# Patient Record
Sex: Male | Born: 2005 | Race: Black or African American | Hispanic: No | Marital: Single | Smoking: Never smoker
Health system: Southern US, Community
[De-identification: ages and names within clinical notes are randomized; demographics above are authoritative.]

## PROBLEM LIST (undated history)

## (undated) DIAGNOSIS — Q539 Undescended testicle, unspecified: Secondary | ICD-10-CM

## (undated) DIAGNOSIS — Z87898 Personal history of other specified conditions: Secondary | ICD-10-CM

## (undated) HISTORY — DX: Undescended testicle, unspecified: Q53.9

## (undated) HISTORY — DX: Personal history of other specified conditions: Z87.898

---
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
## 2023-02-03 ENCOUNTER — Encounter: Payer: Self-pay | Admitting: Cardiovascular Disease

## 2023-02-03 ENCOUNTER — Ambulatory Visit: Payer: Medicaid Other | Attending: Cardiovascular Disease

## 2023-02-03 ENCOUNTER — Ambulatory Visit: Payer: Medicaid Other | Attending: Cardiovascular Disease | Admitting: Cardiovascular Disease

## 2023-02-03 VITALS — BP 124/84 | HR 51 | Ht 73.0 in | Wt 156.2 lb

## 2023-02-03 DIAGNOSIS — R001 Bradycardia, unspecified: Secondary | ICD-10-CM | POA: Diagnosis not present

## 2023-02-03 DIAGNOSIS — R55 Syncope and collapse: Secondary | ICD-10-CM

## 2023-02-03 NOTE — Patient Instructions (Signed)
*If you need a refill on your cardiac medications before your next appointment, please call your pharmacy*   Lab Work: RETURN FOR FASTING LABS If you have labs (blood work) drawn today and your tests are completely normal, you will receive your results only by: MyChart Message (if you have MyChart) OR A paper copy in the mail If you have any lab test that is abnormal or we need to change your treatment, we will call you to review the results.   Testing/Procedures: Your physician has requested that you have an echocardiogram. Echocardiography is a painless test that uses sound waves to create images of your heart. It provides your doctor with information about the size and shape of your heart and how well your heart's chambers and valves are working. This procedure takes approximately one hour. There are no restrictions for this procedure. Please do NOT wear cologne, perfume, aftershave, or lotions (deodorant is allowed). Please arrive 15 minutes prior to your appointment time.  Please note: We ask at that you not bring children with you during ultrasound (echo/ vascular) testing. Due to room size and safety concerns, children are not allowed in the ultrasound rooms during exams. Our front office staff cannot provide observation of children in our lobby area while testing is being conducted. An adult accompanying a patient to their appointment will only be allowed in the ultrasound room at the discretion of the ultrasound technician under special circumstances. We apologize for any inconvenience.   ZIO XT- Long Term Monitor Instructions   Your physician has requested you wear your ZIO patch monitor__14_____days.   This is a single patch monitor.  [employer] supplies one patch monitor per enrollment.  Additional stickers are not available.   Please do not apply patch if you will be having a Nuclear Stress Test, Echocardiogram, Cardiac CT, MRI, or Chest Xray during the time frame you would be  wearing the monitor. The patch cannot be worn during these tests.  You cannot remove and re-apply the ZIO XT patch monitor.   Your ZIO patch monitor will be sent USPS Priority mail from [facility] directly to your home address. The monitor may also be mailed to a PO BOX if home delivery is not available.   It may take 3-5 days to receive your monitor after you have been enrolled.   Once you have received you monitor, please review enclosed instructions.  Your monitor has already been registered assigning a specific monitor serial # to you.   Applying the monitor   Shave hair from upper left chest.   Hold abrader disc by orange tab.  Rub abrader in 40 strokes over left upper chest as indicated in your monitor instructions.   Clean area with 4 enclosed alcohol pads .  Use all pads to assure are is cleaned thoroughly.  Let dry.   Apply patch as indicated in monitor instructions.  Patch will be place under collarbone on left side of chest with arrow pointing upward.   Rub patch adhesive wings for 2 minutes.Remove white label marked "1".  Remove white label marked "2".  Rub patch adhesive wings for 2 additional minutes.   While looking in a mirror, press and release button in center of patch.  A small green light will flash 3-4 times .  This will be your only indicator the monitor has been turned on.     Do not shower for the first 24 hours.  You may shower after the first 24 hours.  Press button if you feel a symptom. You will hear a small click.  Record Date, Time and Symptom in the Patient Log Book.   When you are ready to remove patch, follow instructions on last 2 pages of Patient Log Book.  Stick patch monitor onto last page of Patient Log Book.   Place Patient Log Book in [place] box.  Use locking tab on box and tape box closed securely.  The Orange and [employer] has [employer] on it.  Please place in mailbox as soon as possible.  Your physician should have your test results  approximately 7 days after the monitor has been mailed back to [facility].   Call [facility] Customer Care at [phone] if you have questions regarding your ZIO XT patch monitor.  Call them immediately if you see an orange light blinking on your monitor.   If your monitor falls off in less than 4 days contact our Monitor department at [phone].  If your monitor becomes loose or falls off after 4 days call [employer] at [phone] for suggestions on securing your monitor.     Follow-Up: At [facility], you and your health needs are our priority.  As part of our continuing mission to provide you with exceptional heart care, we have created designated Provider Care Teams.  These Care Teams include your primary Cardiologist (physician) and Advanced Practice Providers (APPs -  Physician Assistants and Nurse Practitioners) who all work together to provide you with the care you need, when you need it.  We recommend signing up for the patient portal called "MyChart".  Sign up information is provided on this After Visit Summary.  MyChart is used to connect with patients for Virtual Visits (Telemedicine).  Patients are able to view lab/test results, encounter notes, upcoming appointments, etc.  Non-urgent messages can be sent to your provider as well.   To learn more about what you can do with MyChart, go to [email or web address].    Your next appointment:   2 month(s)  Provider:   Dr. Nicki Guadalajara or next available APP

## 2023-02-03 NOTE — Progress Notes (Unsigned)
Enrolled for [employer] to mail a ZIO XT long term holter monitor to the patients address on file.  

## 2023-02-03 NOTE — Progress Notes (Signed)
Cardiology Office Note    Date:  02/05/2023   ID:  Keith Berger, DOB September 19, 2005, MRN [id number]  PCP:  Alfredo Martinez, MD  Cardiologist:  Nicki Guadalajara, MD   New cardiology consultation referred by Dr. Alfredo Martinez for evaluation of syncope   History of Present Illness:  Keith Berger is a 17 y.o. male who presents to the office today with his mother for evaluation of 3 presyncope/syncopal spells.  Patient states over the past year he has had 3 episodes of syncope.  The first episode occurred in January.  He was on the couch, and then once he stood up, he then immediately fell to the floor.  There was no associated seizure activity or any awareness of prodrome of tachycardia or palpitations before the episode.  His episode would last seconds and he would get.  He apparently had another episode in June/July.  He works at [employer] and apparently was sweating on a hot day and landed on the floor.  His last episode was in October.  He has tried to stay hydrated.  He is not on any medications.  He was recently evaluated by Dr. Jena Gauss at [facility].  ECG showed was normal and orthostatics were negative.  He was felt to be hemodynamically stable and neurologically intact.  Patient was unaware if his blood sugar was low and he may not had eaten that morning.  He is now referred for cardiology evaluation.   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   No facility-administered medications prior to visit.     Allergies:   Patient has no known allergies.   Social History   Socioeconomic History   Marital status: Single    Spouse name:  Not on file   Number of children: Not on file   Years of education: Not on file   Highest education level: Not on file  Occupational History   Not on file  Tobacco Use   Smoking status: Never   Smokeless tobacco: Never  Substance and Sexual Activity   Alcohol use: Not on file   Drug use: Not on file   Sexual activity: Not on file  Other Topics Concern   Not on file  Social History Narrative   Not on file   Social Drivers of Health   Financial Resource Strain: Not on file  Food Insecurity: Not on file  Transportation Needs: Not on file  Physical Activity: Not on file  Stress: Not on file  Social Connections: Not on file    Socially, he is single, he lives with his mother and sister.  He works as a Clinical research associate at [employer].  He graduated from high school.  There is no tobacco or alcohol use.  He does not routinely exercise.  He does play basketball 3 to 4 days/week.  Family History: His mother is living at age 8.  He is uncertain about his father.  His maternal grandmother and grandfather as well as his mother have hypertension.  He has a sister age 42.  ROS General: Negative; No fevers, chills, or  night sweats;  HEENT: Negative; No changes in vision or hearing, sinus congestion, difficulty swallowing Pulmonary: Negative; No cough, wheezing, shortness of breath, hemoptysis Cardiovascular: See HPI GI: Negative; No nausea, vomiting, diarrhea, or abdominal pain GU: Negative; No dysuria, hematuria, or difficulty voiding Musculoskeletal: Negative; no myalgias, joint pain, or weakness Hematologic/Oncology: Negative; no easy bruising, bleeding Endocrine: Negative; no heat/cold intolerance; no diabetes Neuro: Negative; no changes in balance, headaches Skin: Negative; No rashes or skin lesions Psychiatric: Negative; No behavioral problems, depression Sleep: Negative; No snoring, daytime sleepiness, hypersomnolence, bruxism, restless legs, hypnogognic hallucinations, no  cataplexy Other comprehensive 14 point system review is negative.   PHYSICAL EXAM:   VS:  BP 124/84 (BP Location: Left Arm, Patient Position: Sitting)   Pulse (!) 51   Ht 6\' 1"  (1.854 m)   Wt 156 lb 3.2 oz (70.9 kg)   SpO2 99%   BMI 20.61 kg/m     Blood pressure by me was 112/72 supine, 110/70 sitting, and 118/72 standing.  Wt Readings from Last 3 Encounters:  02/03/23 156 lb 3.2 oz (70.9 kg) (55%, Z= 0.13)*  12/06/22 147 lb (66.7 kg) (41%, Z= -0.23)*  03/08/22 155 lb 12.8 oz (70.7 kg) (60%, Z= 0.26)*   * Growth percentiles are based on CDC (Boys, 2-20 Years) data.    General: Alert, oriented, no distress.  Thin and tall; BMI 20.61 Skin: normal turgor, no rashes, warm and dry HEENT: Normocephalic, atraumatic. Pupils equal round and reactive to light; sclera anicteric; extraocular muscles intact; Nose without nasal septal hypertrophy Mouth/Parynx benign; Mallinpatti scale 2 Neck: No JVD, no carotid bruits; normal carotid upstroke Lungs: clear to ausculatation and percussion; no wheezing or rales Chest wall: without tenderness to palpitation Heart: PMI not displaced, RRR, s1 s2 normal, 1/6 systolic murmur, no diastolic murmur, no rubs, gallops, thrills, or heaves Abdomen: soft, nontender; no hepatosplenomehaly, BS+; abdominal aorta nontender and not dilated by palpation. Back: no CVA tenderness Pulses 2+ Musculoskeletal: full range of motion, normal strength, no joint deformities Extremities: no clubbing cyanosis or edema, Homan's sign negative  Neurologic: grossly nonfocal; Cranial nerves grossly wnl Psychologic: Normal mood and affect   Studies/Labs Reviewed:   EKG Interpretation Date/Time:  Thursday February 03 2023 10:20:26 EST Ventricular Rate:  51 PR Interval:  168 QRS Duration:  100 QT Interval:  444 QTC Calculation: 409 R Axis:   86  Text Interpretation: Sinus bradycardia When compared with ECG of 06-Dec-2022 15:17, Incomplete right bundle branch block is no  longer Present Confirmed by Nicki Guadalajara ([id number]) on 02/03/2023 11:08:15 AM    I personally reviewed the prior ECG from December 06, 2022 which showed sinus bradycardia at 57 bpm, incomplete right bundle branch block.  Mild early repolarization changes.  Recent Labs:    Latest Ref Rng & Units 12/06/2022    3:56 PM 03/08/2022    3:33 PM  BMP  Glucose 70 - 99 mg/dL 71  84   BUN 6 - 20 mg/dL 9  13   Creatinine 1.02 - 1.27 mg/dL 7.25  3.66   BUN/Creat Ratio 9 - 20 11  14    Sodium 134 - 144 mmol/L 140  137   Potassium 3.5 - 5.2 mmol/L 4.8  4.3   Chloride 96 - 106 mmol/L 101  101   CO2 20 - 29 mmol/L 26  22   Calcium 8.7 - 10.2 mg/dL 9.7  44.0         Latest Ref Rng & Units 12/06/2022    3:56 PM  03/08/2022    3:33 PM  Hepatic Function  Total Protein 6.0 - 8.5 g/dL 7.9  7.9   Albumin 4.3 - 5.2 g/dL 4.9  5.0   AST 0 - 40 IU/L 10  10   ALT 0 - 44 IU/L 18  13   Alk Phosphatase 51 - 125 IU/L 132  155   Total Bilirubin 0.0 - 1.2 mg/dL 0.8  1.2        Latest Ref Rng & Units 03/23/2019    5:04 PM 08/27/2006   11:13 PM 08/25/2006   12:23 AM  CBC  WBC 3.4 - 10.8 x10E3/uL 3.8  6.0  12.0   Hemoglobin 12.6 - 17.7 g/dL 62.1  30.8  65.7   Hematocrit 37.5 - 51.0 % 44.3  32.7  33.7   Platelets 150 - 450 x10E3/uL 305  375  281    Lab Results  Component Value Date   MCV 79 03/23/2019   MCV 74.5 08/27/2006   MCV 73.7 08/25/2006   Lab Results  Component Value Date   TSH 1.160 03/08/2022   No results found for: "HGBA1C"   BNP No results found for: "BNP"  ProBNP No results found for: "PROBNP"   Lipid Panel  No results found for: "CHOL", "TRIG", "HDL", "CHOLHDL", "VLDL", "LDLCALC", "LDLDIRECT", "LABVLDL"   RADIOLOGY: No results found.   Additional studies/ records that were reviewed today include:  I have reviewed the records from [facility].  ASSESSMENT:    1. Syncope, unspecified syncope type   2. Sinus bradycardia on ECG     PLAN:  Mr. Dareld Felch is  a 17 year old active male who is recently experienced 3 episodes where he is fallen to the ground with brief possible loss of consciousness.  His initial episode occurred when he had gotten up from a couch and then seemed to hit the ground.  Second episode occurred in the significant heat of the summer raising concern for possible dehydration.  He is unaware of any prodrome of seizure activity or awareness of tachypalpitations.  He does play basketball frequently and at times gets dry and dehydrated.  He has been noted to have sinus bradycardia on his ECG which most likely is a reflection of his aerobic capacity.  His ECG today shows heart rate at 51 bpm with normal intervals and no significant ST-T changes.  He is not on any medication.  Presently, I am recommending that he undergo an echo Doppler study for assessment of LV systolic and diastolic function and valvular architecture.  I am checking fasting laboratory with a comprehensive metabolic panel, magnesium level, CBC, fasting lipid panel and TSH.  I have recommended he wear a Zio patch monitor to assess for potential arrhythmic etiology.  On exam today he is not orthostatic.  If he had not eaten on the days of these events, there is some concern for potential hypoglycemia contributing to his events.  Typically these blackout spells last seconds.  There is no incontinence or evidence of twitching in the extremities.  I will see him in several months for evaluation, if he continues to have recurrent episodes a tilt table study may be considered for future evaluation.   Medication Adjustments/Labs and Tests Ordered: Current medicines are reviewed at length with the patient today.  Concerns regarding medicines are outlined above.  Medication changes, Labs and Tests ordered today are listed in the Patient Instructions below. Patient Instructions   *If you need a refill on your cardiac medications before  your next appointment, please call your  pharmacy*   Lab Work: RETURN FOR FASTING LABS If you have labs (blood work) drawn today and your tests are completely normal, you will receive your results only by: MyChart Message (if you have MyChart) OR A paper copy in the mail If you have any lab test that is abnormal or we need to change your treatment, we will call you to review the results.   Testing/Procedures: Your physician has requested that you have an echocardiogram. Echocardiography is a painless test that uses sound waves to create images of your heart. It provides your doctor with information about the size and shape of your heart and how well your heart's chambers and valves are working. This procedure takes approximately one hour. There are no restrictions for this procedure. Please do NOT wear cologne, perfume, aftershave, or lotions (deodorant is allowed). Please arrive 15 minutes prior to your appointment time.  Please note: We ask at that you not bring children with you during ultrasound (echo/ vascular) testing. Due to room size and safety concerns, children are not allowed in the ultrasound rooms during exams. Our front office staff cannot provide observation of children in our lobby area while testing is being conducted. An adult accompanying a patient to their appointment will only be allowed in the ultrasound room at the discretion of the ultrasound technician under special circumstances. We apologize for any inconvenience.   ZIO XT- Long Term Monitor Instructions   Your physician has requested you wear your ZIO patch monitor__14_____days.   This is a single patch monitor.  [employer] supplies one patch monitor per enrollment.  Additional stickers are not available.   Please do not apply patch if you will be having a Nuclear Stress Test, Echocardiogram, Cardiac CT, MRI, or Chest Xray during the time frame you would be wearing the monitor. The patch cannot be worn during these tests.  You cannot remove and re-apply the  ZIO XT patch monitor.   Your ZIO patch monitor will be sent USPS Priority mail from [facility] directly to your home address. The monitor may also be mailed to a PO BOX if home delivery is not available.   It may take 3-5 days to receive your monitor after you have been enrolled.   Once you have received you monitor, please review enclosed instructions.  Your monitor has already been registered assigning a specific monitor serial # to you.   Applying the monitor   Shave hair from upper left chest.   Hold abrader disc by orange tab.  Rub abrader in 40 strokes over left upper chest as indicated in your monitor instructions.   Clean area with 4 enclosed alcohol pads .  Use all pads to assure are is cleaned thoroughly.  Let dry.   Apply patch as indicated in monitor instructions.  Patch will be place under collarbone on left side of chest with arrow pointing upward.   Rub patch adhesive wings for 2 minutes.Remove white label marked "1".  Remove white label marked "2".  Rub patch adhesive wings for 2 additional minutes.   While looking in a mirror, press and release button in center of patch.  A small green light will flash 3-4 times .  This will be your only indicator the monitor has been turned on.     Do not shower for the first 24 hours.  You may shower after the first 24 hours.   Press button if you feel a symptom. You will  hear a small click.  Record Date, Time and Symptom in the Patient Log Book.   When you are ready to remove patch, follow instructions on last 2 pages of Patient Log Book.  Stick patch monitor onto last page of Patient Log Book.   Place Patient Log Book in [place] box.  Use locking tab on box and tape box closed securely.  The [employer] and [employer] has [employer] on it.  Please place in mailbox as soon as possible.  Your physician should have your test results approximately 7 days after the monitor has been mailed back to [facility].   Call [facility]  Customer Care at [phone] if you have questions regarding your ZIO XT patch monitor.  Call them immediately if you see an orange light blinking on your monitor.   If your monitor falls off in less than 4 days contact our Monitor department at [phone].  If your monitor becomes loose or falls off after 4 days call [employer] at [phone] for suggestions on securing your monitor.     Follow-Up: At [facility], you and your health needs are our priority.  As part of our continuing mission to provide you with exceptional heart care, we have created designated Provider Care Teams.  These Care Teams include your primary Cardiologist (physician) and Advanced Practice Providers (APPs -  Physician Assistants and Nurse Practitioners) who all work together to provide you with the care you need, when you need it.  We recommend signing up for the patient portal called "MyChart".  Sign up information is provided on this After Visit Summary.  MyChart is used to connect with patients for Virtual Visits (Telemedicine).  Patients are able to view lab/test results, encounter notes, upcoming appointments, etc.  Non-urgent messages can be sent to your provider as well.   To learn more about what you can do with MyChart, go to [email or web address].    Your next appointment:   2 month(s)  Provider:   Dr. Nicki Guadalajara or next available APP       Signed, Nicki Guadalajara, MD  02/05/2023 11:44 AM    [facility] [address], [place], [place]  [address] Phone: [phone]

## 2023-02-05 ENCOUNTER — Encounter: Payer: Self-pay | Admitting: Cardiovascular Disease

## 2023-02-07 DIAGNOSIS — R55 Syncope and collapse: Secondary | ICD-10-CM

## 2023-03-07 ENCOUNTER — Ambulatory Visit (HOSPITAL_COMMUNITY): Admission: RE | Admit: 2023-03-07 | Payer: Medicaid Other | Source: Ambulatory Visit

## 2023-03-29 ENCOUNTER — Ambulatory Visit (HOSPITAL_COMMUNITY): Payer: Medicaid Other

## 2023-04-07 ENCOUNTER — Ambulatory Visit: Payer: Medicaid Other | Admitting: Nurse Practitioner

## 2023-04-07 NOTE — Progress Notes (Deleted)
   Office Visit    Patient Name: ALTON BOUKNIGHT Date of Encounter: 04/07/2023  Primary Care Provider:  Alfredo Martinez, MD Primary Cardiologist:  Nicki Guadalajara, MD  Chief Complaint    18 year old male with a history of syncope and sinus bradycardia who presents for follow-up related to syncope.  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   Qualifier: Diagnosis of  By: Bebe Shaggy     UMBILICAL HERNIA, HX OF 06/24/2006   Qualifier: Diagnosis of  By: Ludwig Clarks MD, [facility]     Past Surgical History:  Procedure Laterality Date   UMBILICAL HERNIA REPAIR  2007    Allergies  No Known Allergies   Labs/Other Studies Reviewed    The following studies were reviewed today:     Recent Labs: 12/06/2022: ALT 18; BUN 9; Creatinine, Ser 0.85; Potassium 4.8; Sodium 140  Recent Lipid Panel No results found for: "CHOL", "TRIG", "HDL", "CHOLHDL", "VLDL", "LDLCALC", "LDLDIRECT"  History of Present Illness   18 year old male with the above past medical history including syncope and sinus bradycardia.    Home Medications    No current outpatient medications on file.   No current facility-administered medications for this visit.     Review of Systems    ***.  All other systems reviewed and are otherwise negative except as noted above.    Physical Exam    VS:  There were no vitals taken for this visit. , BMI There is no height or weight on file to calculate BMI.     GEN: Well nourished, well developed, in no acute distress. HEENT: normal. Neck: Supple, no JVD, carotid bruits, or masses. Cardiac: RRR, no murmurs, rubs, or gallops. No clubbing, cyanosis, edema.  Radials/DP/PT 2+ and equal bilaterally.  Respiratory:  Respirations regular and unlabored, clear to auscultation bilaterally. GI: Soft, nontender, nondistended, BS + x 4. MS: no deformity or atrophy. Skin: warm and dry, no rash. Neuro:  Strength and sensation are  intact. Psych: Normal affect.  Accessory Clinical Findings    ECG personally reviewed by me today -    - no acute changes.   Lab Results  Component Value Date   WBC 3.8 03/23/2019   HGB 15.1 03/23/2019   HCT 44.3 03/23/2019   MCV 79 03/23/2019   PLT 305 03/23/2019   Lab Results  Component Value Date   CREATININE 0.85 12/06/2022   BUN 9 12/06/2022   NA 140 12/06/2022   K 4.8 12/06/2022   CL 101 12/06/2022   CO2 26 12/06/2022   Lab Results  Component Value Date   ALT 18 12/06/2022   AST 10 12/06/2022   GGT 19 12/06/2022   ALKPHOS 132 (H) 12/06/2022   BILITOT 0.8 12/06/2022   No results found for: "CHOL", "HDL", "LDLCALC", "LDLDIRECT", "TRIG", "CHOLHDL"  No results found for: "HGBA1C"  Assessment & Plan    1.  ***  No BP recorded.  {Refresh Note OR Click here to enter BP  :1}***   Joylene Grapes, NP 04/07/2023, 5:51 AM

## 2023-04-11 NOTE — Progress Notes (Signed)
Called patient, not able to leave vm regarding heart monitor results.

## 2023-04-14 ENCOUNTER — Ambulatory Visit (HOSPITAL_COMMUNITY): Payer: Medicaid Other | Attending: Cardiovascular Disease

## 2023-04-15 ENCOUNTER — Encounter (HOSPITAL_COMMUNITY): Payer: Self-pay | Admitting: Cardiovascular Disease

## 2023-04-22 NOTE — Progress Notes (Deleted)
 Cardiology Clinic Note   Patient Name: Keith Berger Date of Encounter: 04/22/2023  Primary Care Provider:  Alfredo Martinez, MD Primary Cardiologist:  Nicki Guadalajara, MD  Patient Profile    Keith Berger 18 year old male presents the clinic today for follow-up evaluation of his echocardiogram and syncope.  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  He was seen in follow-up by Dr. Tresa Endo on 02/03/2023.  He reported 3 episodes of syncope.  His first episode had occurred in January.  He described standing up from the couch and then fainting.  He denied associated seizure activity, tachycardia or palpitations before the episode.  He had a second episode in June/July.  During that time he reported that it was very hot.  He was working at the [employer].  He reported sweating and then falling to the floor.  His third episode happened in October.  He had been trying to stay well-hydrated.  He was not on any medications.  He was seen and evaluated by his PCP.  He was noted to have normal EKG and negative for orthostatic blood pressures.  He was felt to be hemodynamically stable and neurologically intact.  Patient had been unaware of his blood sugar and reported that he may not have eaten that morning.  He was referred to cardiology for evaluation.  He wore a cardiac event monitor which showed normal sinus rhythm, 1 run of NSVT lasting 5 beats, occasional PACs and rare isolated PVCs.  No atrial fibrillation or pauses were noted.  An  echocardiogram was also ordered but has not yet been completed.  He presents the clinic today for evaluation and states***.  *** denies chest pain, shortness of breath, lower extremity edema, fatigue, palpitations, melena, hematuria, hemoptysis, diaphoresis, weakness, presyncope, syncope, orthopnea, and PND.  Syncope-denies further episodes.  Had 3 episodes in 2024.  Wore cardiac event monitor which was reassuring.  Details above.  Previous lab work unremarkable Maintain p.o. hydration Change positions slowly Maintain physical activity Maintain blood sugar with regular meals Proceed to echocardiogram  Sinus bradycardia-heart rate today***. Will continue to follow  Disposition: Follow-up with Dr. Tresa Endo or me after echocardiogram.  Home Medications    Prior to Admission medications   Not on File    Family History    No family history on file. He indicated that his mother is alive. He indicated that his father is alive.  Social History    Social History   Socioeconomic History   Marital status: Single    Spouse name: Not on file   Number of children: Not on file   Years of education: Not on file   Highest education level: Not on file  Occupational History   Not on file  Tobacco Use   Smoking status: Never   Smokeless tobacco: Never  Substance and Sexual Activity   Alcohol use: Not on file   Drug use: Not on file   Sexual activity: Not on file  Other  Topics Concern   Not on file  Social History Narrative   Not on file   Social Drivers of Health   Financial Resource Strain: Not on file  Food Insecurity: Not on file  Transportation Needs: Not on file  Physical Activity: Not on file  Stress: Not on file  Social Connections: Not on file  Intimate Partner Violence: Not on file     Review of Systems    General:  No chills, fever, night sweats or weight changes.  Cardiovascular:  No chest pain, dyspnea on exertion, edema, orthopnea, palpitations, paroxysmal  nocturnal dyspnea. Dermatological: No rash, lesions/masses Respiratory: No cough, dyspnea Urologic: No hematuria, dysuria Abdominal:   No nausea, vomiting, diarrhea, bright red blood per rectum, melena, or hematemesis Neurologic:  No visual changes, wkns, changes in mental status. All other systems reviewed and are otherwise negative except as noted above.  Physical Exam    VS:  There were no vitals taken for this visit. , BMI There is no height or weight on file to calculate BMI. GEN: Well nourished, well developed, in no acute distress. HEENT: normal. Neck: Supple, no JVD, carotid bruits, or masses. Cardiac: RRR, no murmurs, rubs, or gallops. No clubbing, cyanosis, edema.  Radials/DP/PT 2+ and equal bilaterally.  Respiratory:  Respirations regular and unlabored, clear to auscultation bilaterally. GI: Soft, nontender, nondistended, BS + x 4. MS: no deformity or atrophy. Skin: warm and dry, no rash. Neuro:  Strength and sensation are intact. Psych: Normal affect.  Accessory Clinical Findings    Recent Labs: 12/06/2022: ALT 18; BUN 9; Creatinine, Ser 0.85; Potassium 4.8; Sodium 140   Recent Lipid Panel No results found for: "CHOL", "TRIG", "HDL", "CHOLHDL", "VLDL", "LDLCALC", "LDLDIRECT"  No BP recorded.  {Refresh Note OR Click here to enter BP  :1}***    ECG personally reviewed by me today- ***    Cardiac event monitor 03/15/2023  Patch Wear Time:  13 days and 23 hours (2024-12-16T20:50:11-498 to 2024-12-30T20:50:03-498)   Patient had a min HR of 38 bpm, max HR of 181 bpm, and avg HR of 68 bpm. Predominant underlying rhythm was Sinus Rhythm. 1 run of Ventricular Tachycardia occurred lasting 5 beats with a max rate of 122 bpm (avg 111 bpm). Isolated SVEs were occasional  (3.7%, 45686), SVE Couplets were rare (<1.0%, 1143), and SVE Triplets were rare (<1.0%, 257). Isolated VEs were rare (<1.0%), and no VE Couplets or VE Triplets were present. Inverted QRS complexes possibly due  to inverted placement of device.   The predominant rhythm was normal sinus rhythm at an average rate at 68 bpm.  The slowest heart rate was sinus bradycardia at 38 bpm which occurred at 8:50 AM on December 22.  There was 1 run of nonsustained VT lasting 5 beats.  There were occasional PACs with rare couplets and triplets.  There were rare isolated PVCs.  There were no episodes of atrial fibrillation or pauses.      Assessment & Plan   1.  ***   Thomasene Ripple. Kylena Mole NP-C     04/22/2023, 6:42 AM [facility] [address] Office [phone] Fax [phone]    I spent***minutes examining this patient, reviewing medications, and using patient centered shared decision making involving their cardiac care.   I spent  20 minutes reviewing past medical history,  medications, and prior cardiac tests.

## 2023-04-25 ENCOUNTER — Ambulatory Visit: Payer: Medicaid Other | Attending: General Practice | Admitting: General Practice
